# Patient Record
Sex: Female | Born: 1950 | Hispanic: No | Smoking: Never smoker
Health system: Southern US, Community
[De-identification: ages and names within clinical notes are randomized; demographics above are authoritative.]

## PROBLEM LIST (undated history)

## (undated) DIAGNOSIS — I10 Essential (primary) hypertension: Secondary | ICD-10-CM

---

## 2004-05-13 ENCOUNTER — Emergency Department: Payer: Self-pay | Admitting: Emergency Medicine

## 2004-07-10 ENCOUNTER — Other Ambulatory Visit: Admission: RE | Admit: 2004-07-10 | Discharge: 2004-07-10 | Payer: Self-pay | Admitting: Family Medicine

## 2005-12-21 ENCOUNTER — Emergency Department: Payer: Self-pay | Admitting: Emergency Medicine

## 2015-04-05 DIAGNOSIS — J3489 Other specified disorders of nose and nasal sinuses: Secondary | ICD-10-CM | POA: Diagnosis not present

## 2015-04-05 DIAGNOSIS — Z833 Family history of diabetes mellitus: Secondary | ICD-10-CM | POA: Diagnosis not present

## 2015-04-05 DIAGNOSIS — Z8249 Family history of ischemic heart disease and other diseases of the circulatory system: Secondary | ICD-10-CM | POA: Diagnosis not present

## 2015-04-05 DIAGNOSIS — I10 Essential (primary) hypertension: Secondary | ICD-10-CM | POA: Diagnosis not present

## 2015-04-05 DIAGNOSIS — J45909 Unspecified asthma, uncomplicated: Secondary | ICD-10-CM | POA: Diagnosis not present

## 2015-05-02 DIAGNOSIS — M549 Dorsalgia, unspecified: Secondary | ICD-10-CM | POA: Diagnosis not present

## 2015-05-02 DIAGNOSIS — J9801 Acute bronchospasm: Secondary | ICD-10-CM | POA: Diagnosis not present

## 2015-05-02 DIAGNOSIS — Z112 Encounter for screening for other bacterial diseases: Secondary | ICD-10-CM | POA: Diagnosis not present

## 2015-05-02 DIAGNOSIS — J209 Acute bronchitis, unspecified: Secondary | ICD-10-CM | POA: Diagnosis not present

## 2015-05-02 DIAGNOSIS — S39012D Strain of muscle, fascia and tendon of lower back, subsequent encounter: Secondary | ICD-10-CM | POA: Diagnosis not present

## 2015-08-09 DIAGNOSIS — Z1389 Encounter for screening for other disorder: Secondary | ICD-10-CM | POA: Diagnosis not present

## 2015-08-09 DIAGNOSIS — I10 Essential (primary) hypertension: Secondary | ICD-10-CM | POA: Diagnosis not present

## 2015-08-30 DIAGNOSIS — Z1231 Encounter for screening mammogram for malignant neoplasm of breast: Secondary | ICD-10-CM | POA: Diagnosis not present

## 2019-08-04 ENCOUNTER — Other Ambulatory Visit: Payer: Self-pay

## 2019-08-04 ENCOUNTER — Emergency Department
Admission: EM | Admit: 2019-08-04 | Discharge: 2019-08-04 | Disposition: A | Discharge status: Home / Self Care | Payer: Medicare Other | Attending: Emergency Medicine | Admitting: Emergency Medicine

## 2019-08-04 ENCOUNTER — Encounter: Payer: Self-pay | Admitting: Intensive Care

## 2019-08-04 ENCOUNTER — Emergency Department: Payer: Medicare Other

## 2019-08-04 DIAGNOSIS — I10 Essential (primary) hypertension: Secondary | ICD-10-CM | POA: Insufficient documentation

## 2019-08-04 DIAGNOSIS — H4710 Unspecified papilledema: Secondary | ICD-10-CM | POA: Insufficient documentation

## 2019-08-04 DIAGNOSIS — Q142 Congenital malformation of optic disc: Secondary | ICD-10-CM | POA: Insufficient documentation

## 2019-08-04 DIAGNOSIS — H47391 Other disorders of optic disc, right eye: Secondary | ICD-10-CM | POA: Diagnosis not present

## 2019-08-04 DIAGNOSIS — H538 Other visual disturbances: Secondary | ICD-10-CM | POA: Diagnosis present

## 2019-08-04 DIAGNOSIS — R29818 Other symptoms and signs involving the nervous system: Secondary | ICD-10-CM | POA: Diagnosis not present

## 2019-08-04 HISTORY — DX: Essential (primary) hypertension: I10

## 2019-08-04 LAB — COMPREHENSIVE METABOLIC PANEL
ALT: 32 U/L (ref 0–44)
AST: 40 U/L (ref 15–41)
Albumin: 4.6 g/dL (ref 3.5–5.0)
Alkaline Phosphatase: 80 U/L (ref 38–126)
Anion gap: 12 (ref 5–15)
BUN: 13 mg/dL (ref 8–23)
CO2: 27 mmol/L (ref 22–32)
Calcium: 10.3 mg/dL (ref 8.9–10.3)
Chloride: 102 mmol/L (ref 98–111)
Creatinine, Ser: 0.71 mg/dL (ref 0.44–1.00)
GFR calc Af Amer: >60 mL/min (ref >60)
GFR calc non Af Amer: >60 mL/min (ref >60)
Glucose, Bld: 107 mg/dL — ABNORMAL HIGH (ref 70–99)
Potassium: 2.9 mmol/L — ABNORMAL LOW (ref 3.5–5.1)
Sodium: 141 mmol/L (ref 135–145)
Total Bilirubin: 1 mg/dL (ref 0.3–1.2)
Total Protein: 8.3 g/dL — ABNORMAL HIGH (ref 6.5–8.1)

## 2019-08-04 LAB — CBC
HCT: 44.2 % (ref 36.0–46.0)
Hemoglobin: 15.2 g/dL — ABNORMAL HIGH (ref 12.0–15.0)
MCH: 30.1 pg (ref 26.0–34.0)
MCHC: 34.4 g/dL (ref 30.0–36.0)
MCV: 87.5 fL (ref 80.0–100.0)
Platelets: 197 10*3/uL (ref 150–400)
RBC: 5.05 MIL/uL (ref 3.87–5.11)
RDW: 12.8 % (ref 11.5–15.5)
WBC: 7.8 10*3/uL (ref 4.0–10.5)
nRBC: 0 % (ref 0.0–0.2)

## 2019-08-04 LAB — C-REACTIVE PROTEIN: CRP: 0.9 mg/dL (ref <1.0)

## 2019-08-04 LAB — SEDIMENTATION RATE: Sed Rate: 10 mm/hr (ref 0–30)

## 2019-08-04 MED ORDER — GADOBUTROL 1 MMOL/ML IV SOLN
9.0000 mL | Freq: Once | INTRAVENOUS | Status: AC | PRN
  Administered 2019-08-04: 9 mL via INTRAVENOUS

## 2019-08-04 NOTE — ED Notes (Signed)
Pt transported to MRI 

## 2019-08-04 NOTE — ED Notes (Signed)
ED Provider Kinner at bedside.

## 2019-08-04 NOTE — ED Notes (Signed)
Pt back from MRI to room

## 2019-08-04 NOTE — ED Notes (Signed)
Report to Yessica, RN   

## 2019-08-04 NOTE — ED Triage Notes (Signed)
Patient started having blurred vision X1 1/2 weeks ago. 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 DDX: optic nerve tumor

## 2019-08-04 NOTE — ED Provider Notes (Signed)
[facility] Emergency Department Provider Note   ____________________________________________    I have reviewed the triage vital signs and the nursing notes.   HISTORY  Chief Complaint Blurred Vision (right)     HPI Nancy Lowe is a 69 y.o. female reports that for approximately a week she has had a focal area of blurriness in her right vision.  She saw her eye doctor on Friday and was referred to a specialist today.  Sent in by specialist for further testing given right optic disc swelling.  Requests MRI, labs.  Patient reports no worsening of symptoms.  No double vision.  No headache.  No neuro deficits.  No nausea or vomiting.  Past Medical History:  Diagnosis Date  . Hypertension     There are no problems to display for this patient.   History reviewed. No pertinent surgical history.  Prior to Admission medications   Not on File     Allergies Patient has no known allergies.  History reviewed. No pertinent family history.  Social History Social History   Tobacco Use  . Smoking status: Never Smoker  . Smokeless tobacco: Never Used  Substance Use Topics  . Alcohol use: Never  . Drug use: Never    Review of Systems  Constitutional: No fever/chills Eyes: As above ENT: No sore throat. Cardiovascular: Denies chest pain. Respiratory: Denies shortness of breath. Gastrointestinal: No abdominal pain.     Genitourinary: Negative for dysuria. Musculoskeletal: Negative for back pain. Skin: Negative for rash. Neurological: Negative for headaches   ____________________________________________   PHYSICAL EXAM:  VITAL SIGNS: ED Triage Vitals [08/04/19 1023]  Enc Vitals Group     BP (!) 154/78     Pulse Rate 65     Resp 16     Temp 98.2 F (36.8 C)     Temp Source Oral     SpO2 95 %     Weight 90.7 kg (200 lb)     Height 1.626 m (5' 4" )     Head Circumference      Peak Flow      Pain Score 0     Pain Loc      Pain  Edu?      Excl. in [place]?     Constitutional: Alert and oriented. Eyes: Conjunctivae are normal.  PERRLA, EOMI  Mouth/Throat: Mucous membranes are moist.   Neck:  Painless ROM Cardiovascular: Normal rate, regular rhythm.  Good peripheral circulation. Respiratory: Normal respiratory effort.  No retractions.    Musculoskeletal:   Warm and well perfused Neurologic:  Normal speech and language. No gross focal neurologic deficits are appreciated.  Skin:  Skin is warm, dry and intact. No rash noted. Psychiatric: Mood and affect are normal. Speech and behavior are normal.  ____________________________________________   LABS (all labs ordered are listed, but only abnormal results are displayed)  Labs Reviewed  CBC - Abnormal; Notable for the following components:      Result Value   Hemoglobin 15.2 (*)    All other components within normal limits  COMPREHENSIVE METABOLIC PANEL - Abnormal; Notable for the following components:   Potassium 2.9 (*)    Glucose, Bld 107 (*)    Total Protein 8.3 (*)    All other components within normal limits  SEDIMENTATION RATE  C-REACTIVE PROTEIN  RPR  ANGIOTENSIN CONVERTING ENZYME  LYSOZYME, SERUM   ____________________________________________  EKG  None ____________________________________________  RADIOLOGY  MRI brain and orbits with and without ____________________________________________  PROCEDURES  Procedure(s) performed: No  Procedures   Critical Care performed: No ____________________________________________   INITIAL IMPRESSION / ASSESSMENT AND PLAN / ED COURSE  Pertinent labs & imaging results that were available during my care of the patient were reviewed by me and considered in my medical decision making (see chart for details).  Patient sent in for MRI brain and orbits with and without contrast, ACE lab, lysozyme, ESR CRP.  MRIs are reassuring, discussed with Dr. Neville Route of ophthalmology, he will follow-up with  the patient.  Patient is relieved to hear imaging results, appropriate for discharge at this time   ____________________________________________   FINAL CLINICAL IMPRESSION(S) / ED DIAGNOSES  Final diagnoses:  Optic disc anomaly        Note:  This document was prepared using Dragon voice recognition software and may include unintentional dictation errors.   Lavonia Drafts, MD 08/04/19 1401

## 2019-08-04 NOTE — Discharge Instructions (Addendum)
Your MRI was reassuring, you still have some labs pending please follow-up with Dr. Neville Route of ophthalmology

## 2019-08-05 DIAGNOSIS — H47011 Ischemic optic neuropathy, right eye: Secondary | ICD-10-CM | POA: Diagnosis not present

## 2019-08-05 LAB — ANGIOTENSIN CONVERTING ENZYME: Angiotensin-Converting Enzyme: 40 U/L (ref 14–82)

## 2019-08-05 LAB — RPR: RPR Ser Ql: NONREACTIVE

## 2019-08-05 LAB — LYSOZYME, SERUM: Lysozyme: 4.4 ug/mL (ref 2.5–12.9)

## 2019-08-09 DIAGNOSIS — Q142 Congenital malformation of optic disc: Secondary | ICD-10-CM | POA: Diagnosis not present

## 2019-08-09 DIAGNOSIS — I10 Essential (primary) hypertension: Secondary | ICD-10-CM | POA: Diagnosis not present

## 2019-08-09 DIAGNOSIS — R739 Hyperglycemia, unspecified: Secondary | ICD-10-CM | POA: Diagnosis not present

## 2019-09-05 DIAGNOSIS — H47011 Ischemic optic neuropathy, right eye: Secondary | ICD-10-CM | POA: Diagnosis not present

## 2019-09-12 DIAGNOSIS — H47011 Ischemic optic neuropathy, right eye: Secondary | ICD-10-CM | POA: Diagnosis not present

## 2019-11-25 DIAGNOSIS — J329 Chronic sinusitis, unspecified: Secondary | ICD-10-CM | POA: Diagnosis not present

## 2019-11-25 DIAGNOSIS — B9789 Other viral agents as the cause of diseases classified elsewhere: Secondary | ICD-10-CM | POA: Diagnosis not present

## 2020-01-30 DIAGNOSIS — Z9181 History of falling: Secondary | ICD-10-CM | POA: Diagnosis not present

## 2020-01-30 DIAGNOSIS — E785 Hyperlipidemia, unspecified: Secondary | ICD-10-CM | POA: Diagnosis not present

## 2020-01-30 DIAGNOSIS — Z0000 Encounter for general adult medical examination without abnormal findings: Secondary | ICD-10-CM | POA: Diagnosis not present

## 2020-02-13 DIAGNOSIS — I10 Essential (primary) hypertension: Secondary | ICD-10-CM | POA: Diagnosis not present

## 2020-03-01 DIAGNOSIS — M85851 Other specified disorders of bone density and structure, right thigh: Secondary | ICD-10-CM | POA: Diagnosis not present

## 2020-03-01 DIAGNOSIS — Z13820 Encounter for screening for osteoporosis: Secondary | ICD-10-CM | POA: Diagnosis not present

## 2020-03-01 DIAGNOSIS — Z1231 Encounter for screening mammogram for malignant neoplasm of breast: Secondary | ICD-10-CM | POA: Diagnosis not present

## 2020-04-17 DIAGNOSIS — M25552 Pain in left hip: Secondary | ICD-10-CM | POA: Diagnosis not present

## 2020-04-17 DIAGNOSIS — G8929 Other chronic pain: Secondary | ICD-10-CM | POA: Diagnosis not present

## 2020-04-17 DIAGNOSIS — Z23 Encounter for immunization: Secondary | ICD-10-CM | POA: Diagnosis not present

## 2020-08-13 DIAGNOSIS — I10 Essential (primary) hypertension: Secondary | ICD-10-CM | POA: Diagnosis not present

## 2020-08-13 DIAGNOSIS — R7303 Prediabetes: Secondary | ICD-10-CM | POA: Diagnosis not present

## 2020-08-13 DIAGNOSIS — Z136 Encounter for screening for cardiovascular disorders: Secondary | ICD-10-CM | POA: Diagnosis not present

## 2020-08-13 DIAGNOSIS — Z139 Encounter for screening, unspecified: Secondary | ICD-10-CM | POA: Diagnosis not present

## 2020-08-13 DIAGNOSIS — K219 Gastro-esophageal reflux disease without esophagitis: Secondary | ICD-10-CM | POA: Diagnosis not present

## 2020-10-16 DIAGNOSIS — R0683 Snoring: Secondary | ICD-10-CM | POA: Diagnosis not present

## 2020-10-16 DIAGNOSIS — G4730 Sleep apnea, unspecified: Secondary | ICD-10-CM | POA: Diagnosis not present

## 2020-10-16 DIAGNOSIS — L918 Other hypertrophic disorders of the skin: Secondary | ICD-10-CM | POA: Diagnosis not present

## 2020-11-11 DIAGNOSIS — N390 Urinary tract infection, site not specified: Secondary | ICD-10-CM | POA: Diagnosis not present

## 2020-11-19 DIAGNOSIS — Z87440 Personal history of urinary (tract) infections: Secondary | ICD-10-CM | POA: Diagnosis not present

## 2020-11-19 DIAGNOSIS — R059 Cough, unspecified: Secondary | ICD-10-CM | POA: Diagnosis not present

## 2020-11-25 DIAGNOSIS — G4733 Obstructive sleep apnea (adult) (pediatric): Secondary | ICD-10-CM | POA: Diagnosis not present

## 2020-11-25 DIAGNOSIS — R0602 Shortness of breath: Secondary | ICD-10-CM | POA: Diagnosis not present

## 2020-11-26 DIAGNOSIS — R0602 Shortness of breath: Secondary | ICD-10-CM | POA: Diagnosis not present

## 2020-11-26 DIAGNOSIS — G4733 Obstructive sleep apnea (adult) (pediatric): Secondary | ICD-10-CM | POA: Diagnosis not present

## 2020-12-04 DIAGNOSIS — R059 Cough, unspecified: Secondary | ICD-10-CM | POA: Diagnosis not present

## 2020-12-04 DIAGNOSIS — Z87440 Personal history of urinary (tract) infections: Secondary | ICD-10-CM | POA: Diagnosis not present

## 2020-12-04 DIAGNOSIS — I10 Essential (primary) hypertension: Secondary | ICD-10-CM | POA: Diagnosis not present

## 2021-01-22 DIAGNOSIS — G4733 Obstructive sleep apnea (adult) (pediatric): Secondary | ICD-10-CM | POA: Diagnosis not present

## 2021-01-22 DIAGNOSIS — G4730 Sleep apnea, unspecified: Secondary | ICD-10-CM | POA: Diagnosis not present

## 2021-01-31 DIAGNOSIS — E785 Hyperlipidemia, unspecified: Secondary | ICD-10-CM | POA: Diagnosis not present

## 2021-01-31 DIAGNOSIS — G4733 Obstructive sleep apnea (adult) (pediatric): Secondary | ICD-10-CM | POA: Diagnosis not present

## 2021-01-31 DIAGNOSIS — Z0000 Encounter for general adult medical examination without abnormal findings: Secondary | ICD-10-CM | POA: Diagnosis not present

## 2021-01-31 DIAGNOSIS — Z9181 History of falling: Secondary | ICD-10-CM | POA: Diagnosis not present

## 2021-02-20 DIAGNOSIS — G4733 Obstructive sleep apnea (adult) (pediatric): Secondary | ICD-10-CM | POA: Diagnosis not present

## 2021-02-25 DIAGNOSIS — Z23 Encounter for immunization: Secondary | ICD-10-CM | POA: Diagnosis not present

## 2021-02-25 DIAGNOSIS — I10 Essential (primary) hypertension: Secondary | ICD-10-CM | POA: Diagnosis not present

## 2021-02-25 DIAGNOSIS — R7303 Prediabetes: Secondary | ICD-10-CM | POA: Diagnosis not present

## 2021-02-25 DIAGNOSIS — K219 Gastro-esophageal reflux disease without esophagitis: Secondary | ICD-10-CM | POA: Diagnosis not present

## 2021-02-25 DIAGNOSIS — G4733 Obstructive sleep apnea (adult) (pediatric): Secondary | ICD-10-CM | POA: Diagnosis not present

## 2021-03-02 DIAGNOSIS — G4733 Obstructive sleep apnea (adult) (pediatric): Secondary | ICD-10-CM | POA: Diagnosis not present

## 2021-03-13 DIAGNOSIS — Z1231 Encounter for screening mammogram for malignant neoplasm of breast: Secondary | ICD-10-CM | POA: Diagnosis not present

## 2021-03-21 DIAGNOSIS — B3731 Acute candidiasis of vulva and vagina: Secondary | ICD-10-CM | POA: Diagnosis not present

## 2021-03-21 DIAGNOSIS — N390 Urinary tract infection, site not specified: Secondary | ICD-10-CM | POA: Diagnosis not present

## 2021-04-02 DIAGNOSIS — G4733 Obstructive sleep apnea (adult) (pediatric): Secondary | ICD-10-CM | POA: Diagnosis not present

## 2021-05-02 DIAGNOSIS — G4733 Obstructive sleep apnea (adult) (pediatric): Secondary | ICD-10-CM | POA: Diagnosis not present

## 2021-05-16 DIAGNOSIS — N390 Urinary tract infection, site not specified: Secondary | ICD-10-CM | POA: Diagnosis not present

## 2021-05-16 DIAGNOSIS — R319 Hematuria, unspecified: Secondary | ICD-10-CM | POA: Diagnosis not present

## 2021-05-16 DIAGNOSIS — R7303 Prediabetes: Secondary | ICD-10-CM | POA: Diagnosis not present

## 2021-05-22 DIAGNOSIS — G4733 Obstructive sleep apnea (adult) (pediatric): Secondary | ICD-10-CM | POA: Diagnosis not present

## 2021-05-28 DIAGNOSIS — N390 Urinary tract infection, site not specified: Secondary | ICD-10-CM | POA: Diagnosis not present

## 2021-06-02 DIAGNOSIS — G4733 Obstructive sleep apnea (adult) (pediatric): Secondary | ICD-10-CM | POA: Diagnosis not present

## 2021-07-03 DIAGNOSIS — G4733 Obstructive sleep apnea (adult) (pediatric): Secondary | ICD-10-CM | POA: Diagnosis not present

## 2021-07-31 DIAGNOSIS — G4733 Obstructive sleep apnea (adult) (pediatric): Secondary | ICD-10-CM | POA: Diagnosis not present

## 2021-08-31 DIAGNOSIS — G4733 Obstructive sleep apnea (adult) (pediatric): Secondary | ICD-10-CM | POA: Diagnosis not present

## 2021-09-02 DIAGNOSIS — Z79899 Other long term (current) drug therapy: Secondary | ICD-10-CM | POA: Diagnosis not present

## 2021-09-02 DIAGNOSIS — K219 Gastro-esophageal reflux disease without esophagitis: Secondary | ICD-10-CM | POA: Diagnosis not present

## 2021-09-02 DIAGNOSIS — Z139 Encounter for screening, unspecified: Secondary | ICD-10-CM | POA: Diagnosis not present

## 2021-09-02 DIAGNOSIS — I10 Essential (primary) hypertension: Secondary | ICD-10-CM | POA: Diagnosis not present

## 2021-09-02 DIAGNOSIS — H9311 Tinnitus, right ear: Secondary | ICD-10-CM | POA: Diagnosis not present

## 2021-09-02 DIAGNOSIS — E785 Hyperlipidemia, unspecified: Secondary | ICD-10-CM | POA: Diagnosis not present

## 2021-09-02 DIAGNOSIS — H9191 Unspecified hearing loss, right ear: Secondary | ICD-10-CM | POA: Diagnosis not present

## 2021-09-02 DIAGNOSIS — R7303 Prediabetes: Secondary | ICD-10-CM | POA: Diagnosis not present

## 2021-09-30 DIAGNOSIS — G4733 Obstructive sleep apnea (adult) (pediatric): Secondary | ICD-10-CM | POA: Diagnosis not present

## 2021-10-31 DIAGNOSIS — G4733 Obstructive sleep apnea (adult) (pediatric): Secondary | ICD-10-CM | POA: Diagnosis not present

## 2022-01-25 IMAGING — MR MR ORBITS WO/W CM
4 of 6 series · 30 of 48 positions shown · IV contrast (gadavist)
Comparison: No pertinent prior studies available for comparison.

CLINICAL DATA: Unilateral optic disc swelling. Additional history
provided: Patient with blurred vision for 1.5 weeks, sent from eye
doctor today for reported unilateral optic disc swelling of right
eye, differential diagnosis includes optic nerve tumor.

EXAM:
MRI OF THE ORBITS WITHOUT AND WITH CONTRAST
TECHNIQUE: Multiplanar, multisequence MR imaging of the orbits was performed
both before and after the administration of intravenous contrast.
CONTRAST:  9mL GADAVIST GADOBUTROL 1 MMOL/ML IV SOLN

[Series 6: T2 · axial · 3.0mm · 0.78mm/px · z∈[-123,-59]mm · 5 of 17 slices shown (1 of 2)]
[im 1/17]
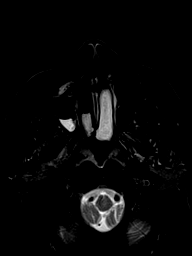
[im 5/17]
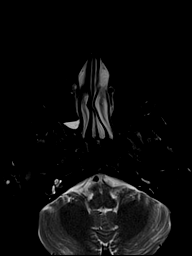
[im 9/17]
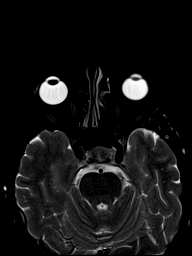
[im 13/17]
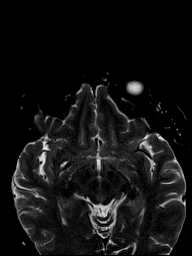
[im 17/17]
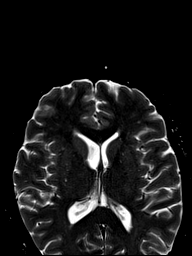

[Series 8: T2 · coronal · 3.0mm · 0.78mm/px · 9 of 35 slices shown (2 of 2)]
[im 1/35]
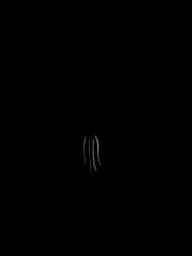
[im 5/35]
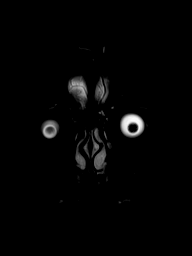
[im 9/35]
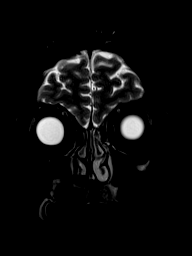
[im 13/35]
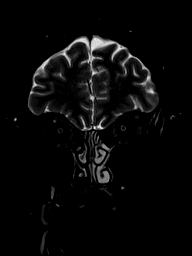
[im 18/35]
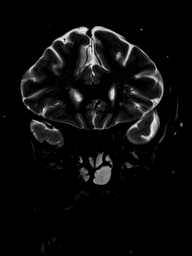
[im 22/35]
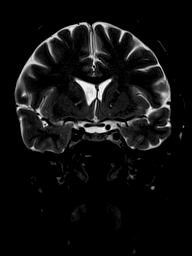
[im 26/35]
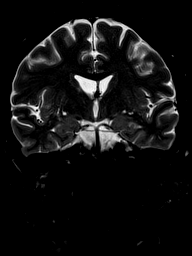
[im 30/35]
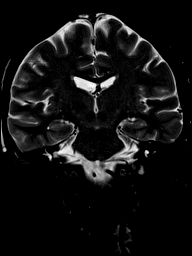
[im 35/35]
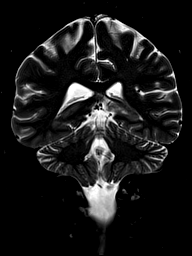

[Series 9: T1 · axial · non-contrast · 3.0mm · 0.31mm/px · z∈[-123,-58]mm · 6 of 23 slices shown (1 of 2)]
[im 1/23]
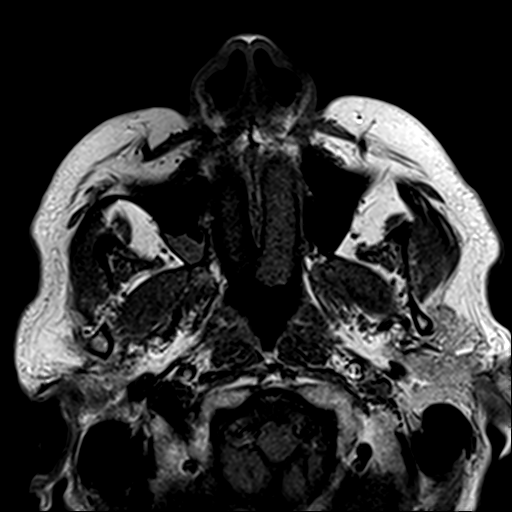
[im 5/23]
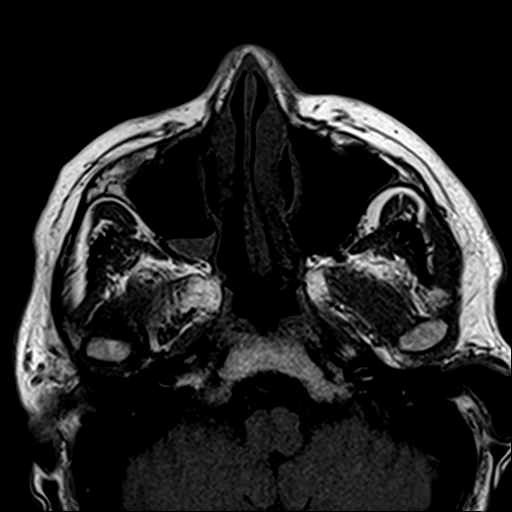
[im 9/23]
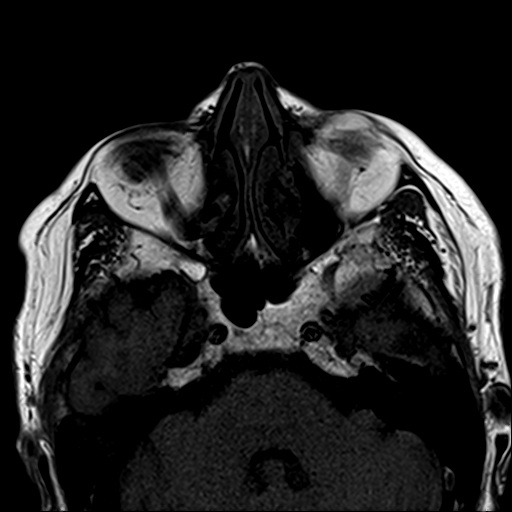
[im 14/23]
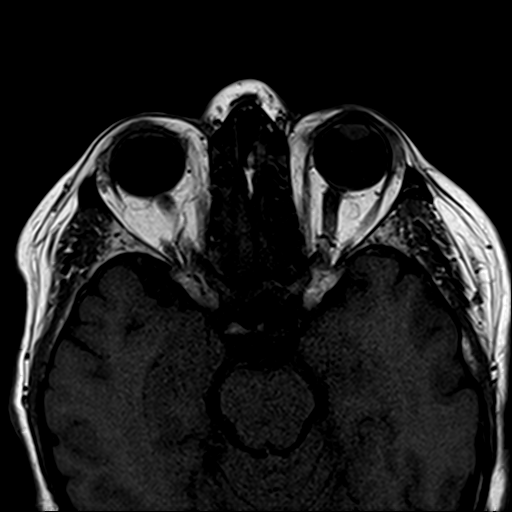
[im 18/23]
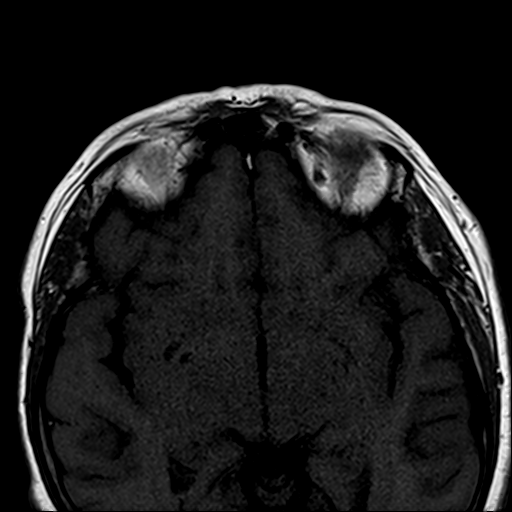
[im 23/23]
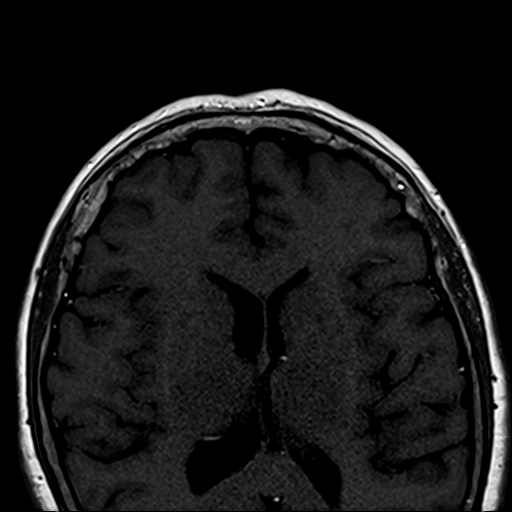

[Series 10: T1 · coronal · non-contrast · 3.0mm · 0.35mm/px · 10 of 40 slices shown (2 of 2)]
[im 1/40]
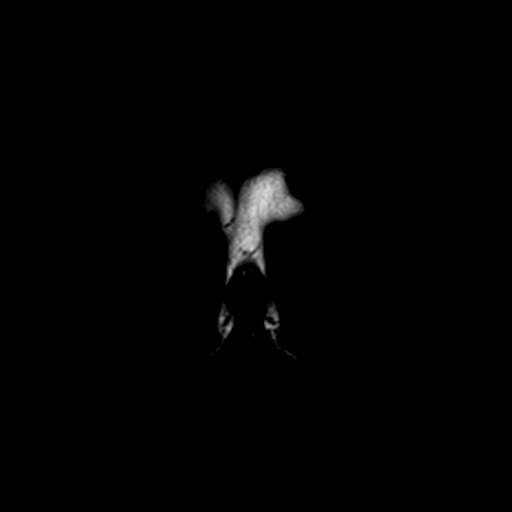
[im 4/40]
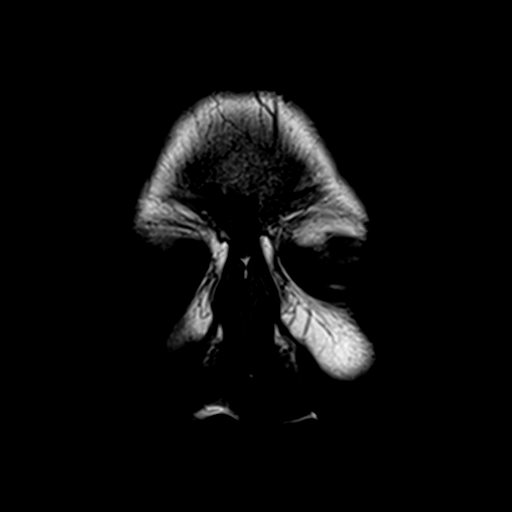
[im 8/40]
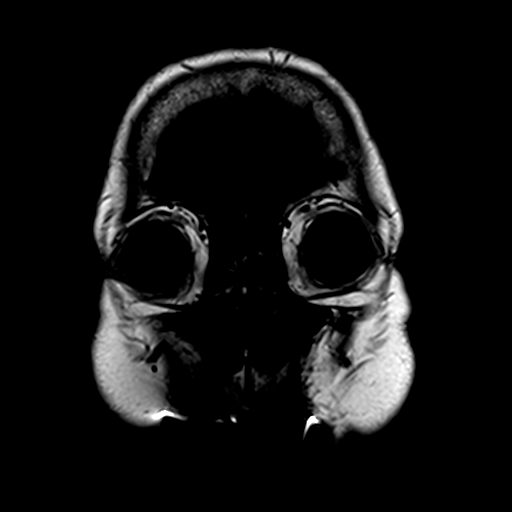
[im 12/40]
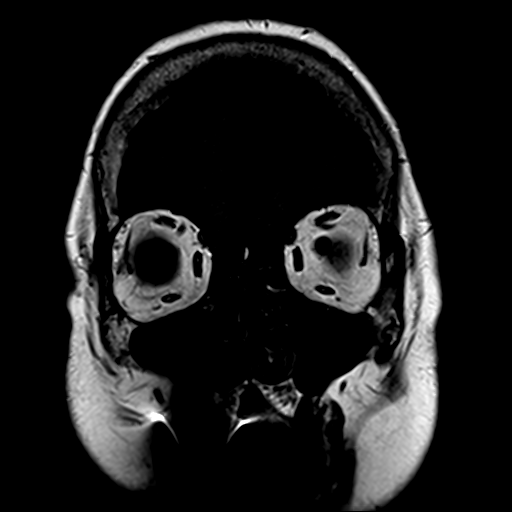
[im 16/40]
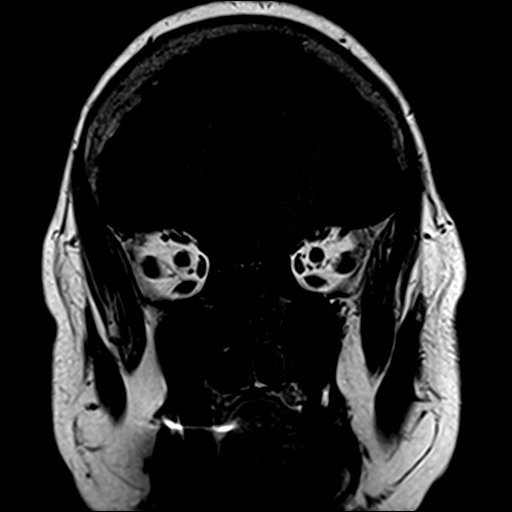
[im 20/40]
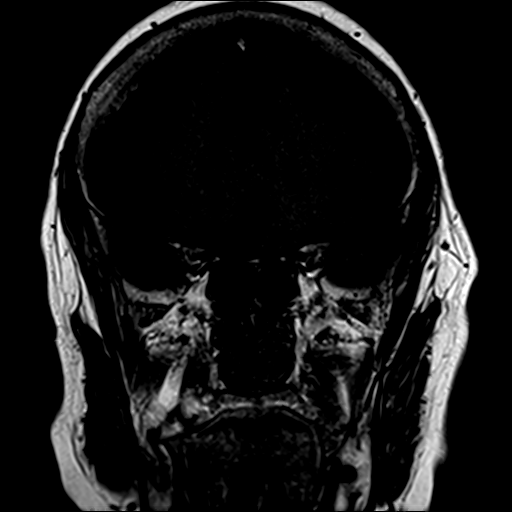
[im 24/40]
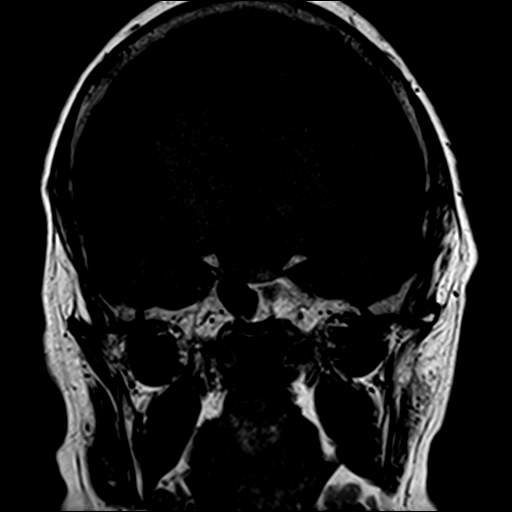
[im 28/40]
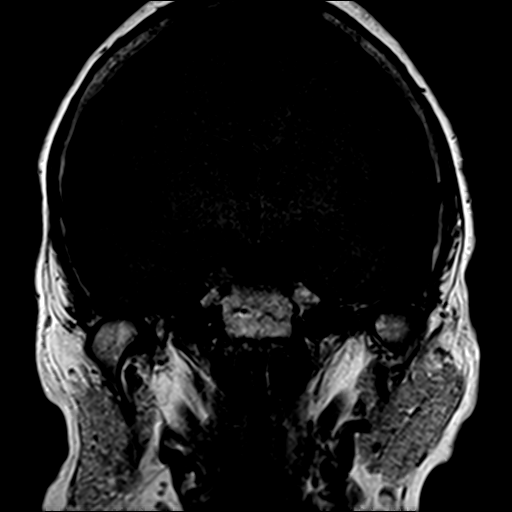
[im 32/40]
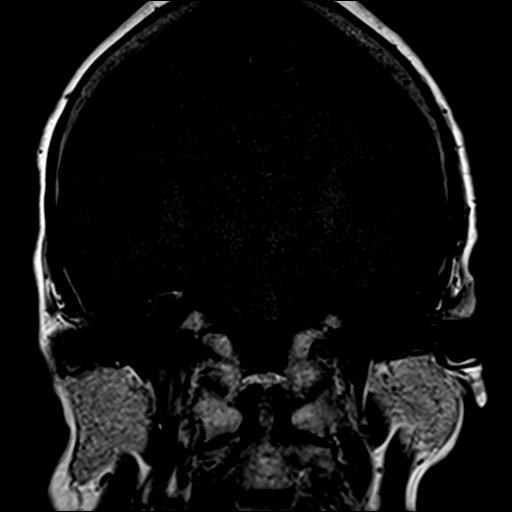
[im 36/40]
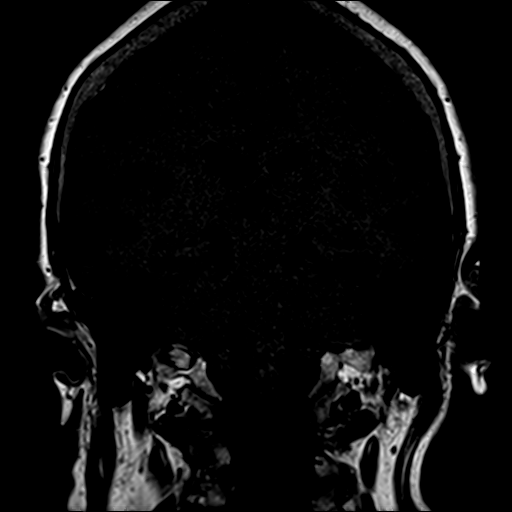

[30 of 48 positions shown; findings below may reference images not displayed]

FINDINGS: The globes are normal in size and contour. The extra-ocular muscles
and optic nerve sheath complexes are symmetric and unremarkable. No
orbital mass or abnormal intraorbital enhancement is demonstrated.
Unremarkable appearance of the optic chiasm.

Right maxillary sinus air-fluid level. Mild mucosal thickening
within the ethmoid and left maxillary sinuses.
IMPRESSION: Unremarkable MRI of the orbits as detailed.

Paranasal sinus disease with right maxillary sinus air-fluid level.
Correlate for acute sinusitis.

## 2022-02-10 DIAGNOSIS — E785 Hyperlipidemia, unspecified: Secondary | ICD-10-CM | POA: Diagnosis not present

## 2022-02-10 DIAGNOSIS — Z9181 History of falling: Secondary | ICD-10-CM | POA: Diagnosis not present

## 2022-02-10 DIAGNOSIS — Z0000 Encounter for general adult medical examination without abnormal findings: Secondary | ICD-10-CM | POA: Diagnosis not present

## 2022-03-05 DIAGNOSIS — I10 Essential (primary) hypertension: Secondary | ICD-10-CM | POA: Diagnosis not present

## 2022-03-05 DIAGNOSIS — Z23 Encounter for immunization: Secondary | ICD-10-CM | POA: Diagnosis not present

## 2022-03-05 DIAGNOSIS — K219 Gastro-esophageal reflux disease without esophagitis: Secondary | ICD-10-CM | POA: Diagnosis not present

## 2022-03-05 DIAGNOSIS — G4733 Obstructive sleep apnea (adult) (pediatric): Secondary | ICD-10-CM | POA: Diagnosis not present

## 2022-03-05 DIAGNOSIS — R7303 Prediabetes: Secondary | ICD-10-CM | POA: Diagnosis not present

## 2022-03-05 DIAGNOSIS — Z86010 Personal history of colonic polyps: Secondary | ICD-10-CM | POA: Diagnosis not present

## 2022-03-20 DIAGNOSIS — Z86010 Personal history of colonic polyps: Secondary | ICD-10-CM | POA: Diagnosis not present

## 2022-03-20 DIAGNOSIS — G4733 Obstructive sleep apnea (adult) (pediatric): Secondary | ICD-10-CM | POA: Diagnosis not present

## 2022-03-20 DIAGNOSIS — Z01818 Encounter for other preprocedural examination: Secondary | ICD-10-CM | POA: Diagnosis not present

## 2022-03-25 DIAGNOSIS — G4733 Obstructive sleep apnea (adult) (pediatric): Secondary | ICD-10-CM | POA: Diagnosis not present

## 2022-03-26 DIAGNOSIS — H905 Unspecified sensorineural hearing loss: Secondary | ICD-10-CM | POA: Diagnosis not present

## 2022-04-02 DIAGNOSIS — D125 Benign neoplasm of sigmoid colon: Secondary | ICD-10-CM | POA: Diagnosis not present

## 2022-04-02 DIAGNOSIS — K648 Other hemorrhoids: Secondary | ICD-10-CM | POA: Diagnosis not present

## 2022-04-02 DIAGNOSIS — Z9989 Dependence on other enabling machines and devices: Secondary | ICD-10-CM | POA: Diagnosis not present

## 2022-04-02 DIAGNOSIS — Z1211 Encounter for screening for malignant neoplasm of colon: Secondary | ICD-10-CM | POA: Diagnosis not present

## 2022-04-02 DIAGNOSIS — Z86010 Personal history of colonic polyps: Secondary | ICD-10-CM | POA: Diagnosis not present

## 2022-04-02 DIAGNOSIS — Z79899 Other long term (current) drug therapy: Secondary | ICD-10-CM | POA: Diagnosis not present

## 2022-04-02 DIAGNOSIS — D122 Benign neoplasm of ascending colon: Secondary | ICD-10-CM | POA: Diagnosis not present

## 2022-04-02 DIAGNOSIS — K635 Polyp of colon: Secondary | ICD-10-CM | POA: Diagnosis not present

## 2022-04-02 DIAGNOSIS — K5730 Diverticulosis of large intestine without perforation or abscess without bleeding: Secondary | ICD-10-CM | POA: Diagnosis not present

## 2022-04-02 DIAGNOSIS — G4733 Obstructive sleep apnea (adult) (pediatric): Secondary | ICD-10-CM | POA: Diagnosis not present

## 2022-04-02 DIAGNOSIS — I10 Essential (primary) hypertension: Secondary | ICD-10-CM | POA: Diagnosis not present

## 2022-04-08 DIAGNOSIS — R1030 Lower abdominal pain, unspecified: Secondary | ICD-10-CM | POA: Diagnosis not present

## 2022-05-14 DIAGNOSIS — H2513 Age-related nuclear cataract, bilateral: Secondary | ICD-10-CM | POA: Diagnosis not present

## 2022-05-14 DIAGNOSIS — H47211 Primary optic atrophy, right eye: Secondary | ICD-10-CM | POA: Diagnosis not present

## 2022-09-04 DIAGNOSIS — Z79899 Other long term (current) drug therapy: Secondary | ICD-10-CM | POA: Diagnosis not present

## 2022-09-04 DIAGNOSIS — K219 Gastro-esophageal reflux disease without esophagitis: Secondary | ICD-10-CM | POA: Diagnosis not present

## 2022-09-04 DIAGNOSIS — M5412 Radiculopathy, cervical region: Secondary | ICD-10-CM | POA: Diagnosis not present

## 2022-09-04 DIAGNOSIS — G4733 Obstructive sleep apnea (adult) (pediatric): Secondary | ICD-10-CM | POA: Diagnosis not present

## 2022-09-04 DIAGNOSIS — I10 Essential (primary) hypertension: Secondary | ICD-10-CM | POA: Diagnosis not present

## 2022-09-04 DIAGNOSIS — R7303 Prediabetes: Secondary | ICD-10-CM | POA: Diagnosis not present

## 2022-09-04 DIAGNOSIS — Z139 Encounter for screening, unspecified: Secondary | ICD-10-CM | POA: Diagnosis not present

## 2022-10-02 DIAGNOSIS — M47812 Spondylosis without myelopathy or radiculopathy, cervical region: Secondary | ICD-10-CM | POA: Diagnosis not present

## 2022-10-02 DIAGNOSIS — M4722 Other spondylosis with radiculopathy, cervical region: Secondary | ICD-10-CM | POA: Diagnosis not present

## 2022-11-20 DIAGNOSIS — M25521 Pain in right elbow: Secondary | ICD-10-CM | POA: Diagnosis not present

## 2022-11-20 DIAGNOSIS — M25511 Pain in right shoulder: Secondary | ICD-10-CM | POA: Diagnosis not present

## 2022-11-20 DIAGNOSIS — G5601 Carpal tunnel syndrome, right upper limb: Secondary | ICD-10-CM | POA: Diagnosis not present

## 2022-12-11 DIAGNOSIS — M25521 Pain in right elbow: Secondary | ICD-10-CM | POA: Diagnosis not present

## 2022-12-11 DIAGNOSIS — S56511A Strain of other extensor muscle, fascia and tendon at forearm level, right arm, initial encounter: Secondary | ICD-10-CM | POA: Diagnosis not present

## 2022-12-11 DIAGNOSIS — M25511 Pain in right shoulder: Secondary | ICD-10-CM | POA: Diagnosis not present

## 2022-12-18 DIAGNOSIS — G4733 Obstructive sleep apnea (adult) (pediatric): Secondary | ICD-10-CM | POA: Diagnosis not present

## 2023-02-19 DIAGNOSIS — Z1231 Encounter for screening mammogram for malignant neoplasm of breast: Secondary | ICD-10-CM | POA: Diagnosis not present

## 2023-02-19 DIAGNOSIS — Z0000 Encounter for general adult medical examination without abnormal findings: Secondary | ICD-10-CM | POA: Diagnosis not present

## 2023-02-19 DIAGNOSIS — Z139 Encounter for screening, unspecified: Secondary | ICD-10-CM | POA: Diagnosis not present

## 2023-02-19 DIAGNOSIS — Z9181 History of falling: Secondary | ICD-10-CM | POA: Diagnosis not present

## 2023-03-09 DIAGNOSIS — K219 Gastro-esophageal reflux disease without esophagitis: Secondary | ICD-10-CM | POA: Diagnosis not present

## 2023-03-09 DIAGNOSIS — R7303 Prediabetes: Secondary | ICD-10-CM | POA: Diagnosis not present

## 2023-03-09 DIAGNOSIS — M79601 Pain in right arm: Secondary | ICD-10-CM | POA: Diagnosis not present

## 2023-03-09 DIAGNOSIS — G4733 Obstructive sleep apnea (adult) (pediatric): Secondary | ICD-10-CM | POA: Diagnosis not present

## 2023-03-09 DIAGNOSIS — E785 Hyperlipidemia, unspecified: Secondary | ICD-10-CM | POA: Diagnosis not present

## 2023-03-09 DIAGNOSIS — I10 Essential (primary) hypertension: Secondary | ICD-10-CM | POA: Diagnosis not present

## 2023-03-09 DIAGNOSIS — Z23 Encounter for immunization: Secondary | ICD-10-CM | POA: Diagnosis not present

## 2023-04-03 DIAGNOSIS — Z1231 Encounter for screening mammogram for malignant neoplasm of breast: Secondary | ICD-10-CM | POA: Diagnosis not present

## 2023-09-09 DIAGNOSIS — Z23 Encounter for immunization: Secondary | ICD-10-CM | POA: Diagnosis not present

## 2023-09-09 DIAGNOSIS — G4733 Obstructive sleep apnea (adult) (pediatric): Secondary | ICD-10-CM | POA: Diagnosis not present

## 2023-09-09 DIAGNOSIS — E785 Hyperlipidemia, unspecified: Secondary | ICD-10-CM | POA: Diagnosis not present

## 2023-09-09 DIAGNOSIS — L84 Corns and callosities: Secondary | ICD-10-CM | POA: Diagnosis not present

## 2023-09-09 DIAGNOSIS — K219 Gastro-esophageal reflux disease without esophagitis: Secondary | ICD-10-CM | POA: Diagnosis not present

## 2023-09-09 DIAGNOSIS — E876 Hypokalemia: Secondary | ICD-10-CM | POA: Diagnosis not present

## 2023-09-09 DIAGNOSIS — R7303 Prediabetes: Secondary | ICD-10-CM | POA: Diagnosis not present

## 2023-09-09 DIAGNOSIS — M25551 Pain in right hip: Secondary | ICD-10-CM | POA: Diagnosis not present

## 2023-09-09 DIAGNOSIS — I10 Essential (primary) hypertension: Secondary | ICD-10-CM | POA: Diagnosis not present

## 2023-11-30 DIAGNOSIS — L299 Pruritus, unspecified: Secondary | ICD-10-CM | POA: Diagnosis not present

## 2023-11-30 DIAGNOSIS — L728 Other follicular cysts of the skin and subcutaneous tissue: Secondary | ICD-10-CM | POA: Diagnosis not present

## 2023-11-30 DIAGNOSIS — L300 Nummular dermatitis: Secondary | ICD-10-CM | POA: Diagnosis not present

## 2023-12-23 DIAGNOSIS — L299 Pruritus, unspecified: Secondary | ICD-10-CM | POA: Diagnosis not present

## 2023-12-23 DIAGNOSIS — R531 Weakness: Secondary | ICD-10-CM | POA: Diagnosis not present

## 2023-12-23 DIAGNOSIS — L300 Nummular dermatitis: Secondary | ICD-10-CM | POA: Diagnosis not present

## 2024-01-13 DIAGNOSIS — L270 Generalized skin eruption due to drugs and medicaments taken internally: Secondary | ICD-10-CM | POA: Diagnosis not present

## 2024-03-17 DIAGNOSIS — K219 Gastro-esophageal reflux disease without esophagitis: Secondary | ICD-10-CM | POA: Diagnosis not present

## 2024-03-17 DIAGNOSIS — G4733 Obstructive sleep apnea (adult) (pediatric): Secondary | ICD-10-CM | POA: Diagnosis not present

## 2024-03-17 DIAGNOSIS — Z139 Encounter for screening, unspecified: Secondary | ICD-10-CM | POA: Diagnosis not present

## 2024-03-17 DIAGNOSIS — E876 Hypokalemia: Secondary | ICD-10-CM | POA: Diagnosis not present

## 2024-03-17 DIAGNOSIS — Z1231 Encounter for screening mammogram for malignant neoplasm of breast: Secondary | ICD-10-CM | POA: Diagnosis not present

## 2024-03-17 DIAGNOSIS — Z23 Encounter for immunization: Secondary | ICD-10-CM | POA: Diagnosis not present

## 2024-03-17 DIAGNOSIS — R7303 Prediabetes: Secondary | ICD-10-CM | POA: Diagnosis not present

## 2024-03-17 DIAGNOSIS — Z9181 History of falling: Secondary | ICD-10-CM | POA: Diagnosis not present

## 2024-03-17 DIAGNOSIS — I10 Essential (primary) hypertension: Secondary | ICD-10-CM | POA: Diagnosis not present

## 2024-03-17 DIAGNOSIS — E785 Hyperlipidemia, unspecified: Secondary | ICD-10-CM | POA: Diagnosis not present
